# Patient Record
Sex: Male | Born: 1975 | Race: White | Hispanic: No | Marital: Married | Smoking: Current every day smoker
Health system: Southern US, Community
[De-identification: ages and names within clinical notes are randomized; demographics above are authoritative.]

## PROBLEM LIST (undated history)

## (undated) HISTORY — PX: APPENDECTOMY: SHX54

## (undated) HISTORY — PX: OTHER SURGICAL HISTORY: SHX169

---

## 2013-09-08 ENCOUNTER — Encounter (HOSPITAL_COMMUNITY)
Admission: EM | Disposition: A | Discharge status: Home / Self Care | Payer: Self-pay | Source: Home / Self Care | Attending: Emergency Medicine

## 2013-09-08 ENCOUNTER — Emergency Department (HOSPITAL_COMMUNITY): Payer: Worker's Compensation | Admitting: Certified Registered Nurse Anesthetist

## 2013-09-08 ENCOUNTER — Encounter (HOSPITAL_COMMUNITY): Payer: Worker's Compensation | Admitting: Certified Registered Nurse Anesthetist

## 2013-09-08 ENCOUNTER — Observation Stay (HOSPITAL_COMMUNITY)
Admission: EM | Admit: 2013-09-08 | Discharge: 2013-09-09 | Disposition: A | Discharge status: Home / Self Care | Payer: Worker's Compensation | Attending: Orthopedic Surgery | Admitting: Orthopedic Surgery

## 2013-09-08 ENCOUNTER — Emergency Department (HOSPITAL_COMMUNITY): Payer: Worker's Compensation

## 2013-09-08 ENCOUNTER — Encounter (HOSPITAL_COMMUNITY): Payer: Self-pay | Admitting: Emergency Medicine

## 2013-09-08 DIAGNOSIS — S51009A Unspecified open wound of unspecified elbow, initial encounter: Secondary | ICD-10-CM

## 2013-09-08 DIAGNOSIS — S51809A Unspecified open wound of unspecified forearm, initial encounter: Secondary | ICD-10-CM | POA: Insufficient documentation

## 2013-09-08 DIAGNOSIS — S66829A Laceration of other specified muscles, fascia and tendons at wrist and hand level, unspecified hand, initial encounter: Secondary | ICD-10-CM

## 2013-09-08 DIAGNOSIS — W298XXA Contact with other powered powered hand tools and household machinery, initial encounter: Secondary | ICD-10-CM | POA: Insufficient documentation

## 2013-09-08 DIAGNOSIS — S56529A Laceration of other extensor muscle, fascia and tendon at forearm level, unspecified arm, initial encounter: Secondary | ICD-10-CM

## 2013-09-08 DIAGNOSIS — S66909A Unspecified injury of unspecified muscle, fascia and tendon at wrist and hand level, unspecified hand, initial encounter: Principal | ICD-10-CM

## 2013-09-08 DIAGNOSIS — S61409A Unspecified open wound of unspecified hand, initial encounter: Principal | ICD-10-CM | POA: Diagnosis present

## 2013-09-08 DIAGNOSIS — F17200 Nicotine dependence, unspecified, uncomplicated: Secondary | ICD-10-CM | POA: Insufficient documentation

## 2013-09-08 DIAGNOSIS — S5420XA Injury of radial nerve at forearm level, unspecified arm, initial encounter: Secondary | ICD-10-CM | POA: Insufficient documentation

## 2013-09-08 DIAGNOSIS — S56909A Unspecified injury of unspecified muscles, fascia and tendons at forearm level, unspecified arm, initial encounter: Secondary | ICD-10-CM

## 2013-09-08 HISTORY — PX: NERVE AND ARTERY REPAIR: SHX5694

## 2013-09-08 HISTORY — PX: I & D EXTREMITY: SHX5045

## 2013-09-08 SURGERY — IRRIGATION AND DEBRIDEMENT EXTREMITY
Anesthesia: General | Laterality: Right

## 2013-09-08 MED ORDER — FENTANYL CITRATE 0.05 MG/ML IJ SOLN
INTRAMUSCULAR | Status: AC
  Filled 2013-09-08: qty 5

## 2013-09-08 MED ORDER — ONDANSETRON HCL 4 MG/2ML IJ SOLN
4.0000 mg | Freq: Once | INTRAMUSCULAR | Status: AC
  Administered 2013-09-08: 4 mg via INTRAVENOUS
  Filled 2013-09-08: qty 2

## 2013-09-08 MED ORDER — SUCCINYLCHOLINE CHLORIDE 20 MG/ML IJ SOLN
INTRAMUSCULAR | Status: AC
  Filled 2013-09-08: qty 1

## 2013-09-08 MED ORDER — ONDANSETRON HCL 4 MG/2ML IJ SOLN
4.0000 mg | Freq: Four times a day (QID) | INTRAMUSCULAR | Status: DC | PRN
  Administered 2013-09-09 (×2): 4 mg via INTRAVENOUS
  Filled 2013-09-08 (×2): qty 2

## 2013-09-08 MED ORDER — SENNA 8.6 MG PO TABS
1.0000 | ORAL_TABLET | Freq: Two times a day (BID) | ORAL | Status: DC
  Administered 2013-09-08 – 2013-09-09 (×2): 8.6 mg via ORAL
  Filled 2013-09-08 (×3): qty 1

## 2013-09-08 MED ORDER — FAMOTIDINE 20 MG PO TABS
20.0000 mg | ORAL_TABLET | Freq: Two times a day (BID) | ORAL | Status: DC | PRN
  Filled 2013-09-08: qty 1

## 2013-09-08 MED ORDER — CEFAZOLIN SODIUM 1-5 GM-% IV SOLN
1.0000 g | Freq: Three times a day (TID) | INTRAVENOUS | Status: DC
  Administered 2013-09-09 (×3): 1 g via INTRAVENOUS
  Filled 2013-09-08 (×5): qty 50

## 2013-09-08 MED ORDER — DEXAMETHASONE SODIUM PHOSPHATE 4 MG/ML IJ SOLN
INTRAMUSCULAR | Status: AC
  Filled 2013-09-08: qty 2

## 2013-09-08 MED ORDER — BUPIVACAINE HCL (PF) 0.25 % IJ SOLN
INTRAMUSCULAR | Status: DC | PRN
  Administered 2013-09-08: 30 mL

## 2013-09-08 MED ORDER — SODIUM CHLORIDE 0.9 % IR SOLN
Status: DC | PRN
  Administered 2013-09-08: 1000 mL

## 2013-09-08 MED ORDER — ALPRAZOLAM 0.5 MG PO TABS: 0.5000 mg | ORAL_TABLET | Freq: Four times a day (QID) | ORAL | Status: DC | PRN

## 2013-09-08 MED ORDER — MIDAZOLAM HCL 2 MG/2ML IJ SOLN
INTRAMUSCULAR | Status: AC
  Filled 2013-09-08: qty 2

## 2013-09-08 MED ORDER — GLYCOPYRROLATE 0.2 MG/ML IJ SOLN
INTRAMUSCULAR | Status: AC
Start: 2013-09-08 — End: 2013-09-08
  Filled 2013-09-08: qty 3

## 2013-09-08 MED ORDER — LIDOCAINE HCL (CARDIAC) 20 MG/ML IV SOLN
INTRAVENOUS | Status: DC | PRN
  Administered 2013-09-08: 40 mg via INTRAVENOUS

## 2013-09-08 MED ORDER — LACTATED RINGERS IV SOLN
INTRAVENOUS | Status: DC
  Administered 2013-09-08: 23:00:00 via INTRAVENOUS

## 2013-09-08 MED ORDER — SUCCINYLCHOLINE CHLORIDE 20 MG/ML IJ SOLN
INTRAMUSCULAR | Status: DC | PRN
  Administered 2013-09-08: 100 mg via INTRAVENOUS

## 2013-09-08 MED ORDER — ROCURONIUM BROMIDE 50 MG/5ML IV SOLN
INTRAVENOUS | Status: AC
  Filled 2013-09-08: qty 1

## 2013-09-08 MED ORDER — ROCURONIUM BROMIDE 100 MG/10ML IV SOLN
INTRAVENOUS | Status: DC | PRN
  Administered 2013-09-08: 30 mg via INTRAVENOUS

## 2013-09-08 MED ORDER — CEFAZOLIN SODIUM 1-5 GM-% IV SOLN
INTRAVENOUS | Status: AC
  Filled 2013-09-08: qty 50

## 2013-09-08 MED ORDER — GLYCOPYRROLATE 0.2 MG/ML IJ SOLN
INTRAMUSCULAR | Status: DC | PRN
  Administered 2013-09-08: .5 mg via INTRAVENOUS

## 2013-09-08 MED ORDER — DEXAMETHASONE SODIUM PHOSPHATE 4 MG/ML IJ SOLN
INTRAMUSCULAR | Status: DC | PRN
  Administered 2013-09-08: 8 mg via INTRAVENOUS

## 2013-09-08 MED ORDER — ONDANSETRON HCL 4 MG/2ML IJ SOLN
INTRAMUSCULAR | Status: AC
  Filled 2013-09-08: qty 2

## 2013-09-08 MED ORDER — METHOCARBAMOL 100 MG/ML IJ SOLN
500.0000 mg | Freq: Four times a day (QID) | INTRAVENOUS | Status: DC | PRN
  Filled 2013-09-08: qty 5

## 2013-09-08 MED ORDER — MORPHINE SULFATE 2 MG/ML IJ SOLN: 1.0000 mg | INTRAMUSCULAR | Status: DC | PRN

## 2013-09-08 MED ORDER — LIDOCAINE HCL (CARDIAC) 20 MG/ML IV SOLN
INTRAVENOUS | Status: AC
  Filled 2013-09-08: qty 5

## 2013-09-08 MED ORDER — CEFAZOLIN SODIUM-DEXTROSE 2-3 GM-% IV SOLR
1.0000 g | Freq: Once | INTRAVENOUS | Status: AC
  Administered 2013-09-08: 1 g via INTRAVENOUS

## 2013-09-08 MED ORDER — BUPIVACAINE HCL (PF) 0.25 % IJ SOLN
INTRAMUSCULAR | Status: AC
  Filled 2013-09-08: qty 30

## 2013-09-08 MED ORDER — OXYCODONE HCL 5 MG PO TABS
5.0000 mg | ORAL_TABLET | ORAL | Status: DC | PRN
  Administered 2013-09-09 (×4): 10 mg via ORAL
  Filled 2013-09-08 (×4): qty 2

## 2013-09-08 MED ORDER — METHOCARBAMOL 500 MG PO TABS
ORAL_TABLET | ORAL | Status: AC
  Filled 2013-09-08: qty 1

## 2013-09-08 MED ORDER — LACTATED RINGERS IV SOLN
INTRAVENOUS | Status: DC | PRN
  Administered 2013-09-08 (×2): via INTRAVENOUS

## 2013-09-08 MED ORDER — METHOCARBAMOL 500 MG PO TABS
500.0000 mg | ORAL_TABLET | Freq: Four times a day (QID) | ORAL | Status: DC | PRN
  Administered 2013-09-08 – 2013-09-09 (×2): 500 mg via ORAL
  Filled 2013-09-08 (×2): qty 1

## 2013-09-08 MED ORDER — CEFAZOLIN SODIUM 1-5 GM-% IV SOLN
1.0000 g | Freq: Once | INTRAVENOUS | Status: AC
  Administered 2013-09-08: 1 g via INTRAVENOUS
  Filled 2013-09-08: qty 50

## 2013-09-08 MED ORDER — GLYCOPYRROLATE 0.2 MG/ML IJ SOLN
INTRAMUSCULAR | Status: AC
  Filled 2013-09-08: qty 1

## 2013-09-08 MED ORDER — HYDROMORPHONE HCL PF 1 MG/ML IJ SOLN
0.2500 mg | INTRAMUSCULAR | Status: DC | PRN
  Administered 2013-09-08 (×2): 0.5 mg via INTRAVENOUS

## 2013-09-08 MED ORDER — OXYCODONE HCL 5 MG/5ML PO SOLN: 5.0000 mg | Freq: Once | ORAL | Status: AC | PRN

## 2013-09-08 MED ORDER — FENTANYL CITRATE 0.05 MG/ML IJ SOLN
INTRAMUSCULAR | Status: DC | PRN
  Administered 2013-09-08 (×2): 100 ug via INTRAVENOUS
  Administered 2013-09-08: 50 ug via INTRAVENOUS

## 2013-09-08 MED ORDER — OXYCODONE HCL 5 MG PO TABS
ORAL_TABLET | ORAL | Status: AC
  Filled 2013-09-08: qty 1

## 2013-09-08 MED ORDER — CEFAZOLIN SODIUM 1-5 GM-% IV SOLN: 1.0000 g | INTRAVENOUS | Status: DC

## 2013-09-08 MED ORDER — HYDROMORPHONE HCL PF 1 MG/ML IJ SOLN
INTRAMUSCULAR | Status: AC
  Filled 2013-09-08: qty 1

## 2013-09-08 MED ORDER — ARTIFICIAL TEARS OP OINT
TOPICAL_OINTMENT | OPHTHALMIC | Status: AC
  Filled 2013-09-08: qty 3.5

## 2013-09-08 MED ORDER — MORPHINE SULFATE 4 MG/ML IJ SOLN
4.0000 mg | INTRAMUSCULAR | Status: DC | PRN
  Administered 2013-09-08: 4 mg via INTRAVENOUS
  Filled 2013-09-08: qty 1

## 2013-09-08 MED ORDER — ONDANSETRON HCL 4 MG/2ML IJ SOLN
INTRAMUSCULAR | Status: DC | PRN
  Administered 2013-09-08: 4 mg via INTRAVENOUS

## 2013-09-08 MED ORDER — MORPHINE SULFATE 4 MG/ML IJ SOLN
4.0000 mg | INTRAMUSCULAR | Status: AC | PRN
  Administered 2013-09-08 (×3): 4 mg via INTRAVENOUS
  Filled 2013-09-08 (×2): qty 1

## 2013-09-08 MED ORDER — SODIUM CHLORIDE 0.9 % IR SOLN
Status: DC | PRN
  Administered 2013-09-08: 6000 mL

## 2013-09-08 MED ORDER — PROPOFOL 10 MG/ML IV BOLUS
INTRAVENOUS | Status: DC | PRN
  Administered 2013-09-08: 50 mg via INTRAVENOUS
  Administered 2013-09-08: 100 mg via INTRAVENOUS
  Administered 2013-09-08: 200 mg via INTRAVENOUS

## 2013-09-08 MED ORDER — VITAMIN C 500 MG PO TABS
1000.0000 mg | ORAL_TABLET | Freq: Every day | ORAL | Status: DC
  Administered 2013-09-08 – 2013-09-09 (×2): 1000 mg via ORAL
  Filled 2013-09-08 (×2): qty 2

## 2013-09-08 MED ORDER — ONDANSETRON HCL 4 MG PO TABS: 4.0000 mg | ORAL_TABLET | Freq: Four times a day (QID) | ORAL | Status: DC | PRN

## 2013-09-08 MED ORDER — DIPHENHYDRAMINE HCL 25 MG PO CAPS: 25.0000 mg | ORAL_CAPSULE | Freq: Four times a day (QID) | ORAL | Status: DC | PRN

## 2013-09-08 MED ORDER — OXYCODONE HCL 5 MG PO TABS
5.0000 mg | ORAL_TABLET | Freq: Once | ORAL | Status: AC | PRN
  Administered 2013-09-08: 5 mg via ORAL

## 2013-09-08 MED ORDER — ARTIFICIAL TEARS OP OINT
TOPICAL_OINTMENT | OPHTHALMIC | Status: DC | PRN
  Administered 2013-09-08: 1 via OPHTHALMIC

## 2013-09-08 MED ORDER — NEOSTIGMINE METHYLSULFATE 1 MG/ML IJ SOLN
INTRAMUSCULAR | Status: DC | PRN
  Administered 2013-09-08: 3 mg via INTRAVENOUS

## 2013-09-08 SURGICAL SUPPLY — 78 items
BANDAGE CONFORM 2  STR LF (GAUZE/BANDAGES/DRESSINGS) IMPLANT
BANDAGE ELASTIC 3 VELCRO ST LF (GAUZE/BANDAGES/DRESSINGS) ×3 IMPLANT
BANDAGE ELASTIC 4 VELCRO ST LF (GAUZE/BANDAGES/DRESSINGS) IMPLANT
BANDAGE GAUZE ELAST BULKY 4 IN (GAUZE/BANDAGES/DRESSINGS) ×3 IMPLANT
BNDG COHESIVE 1X5 TAN STRL LF (GAUZE/BANDAGES/DRESSINGS) IMPLANT
CANISTER SUCTION 1500CC (MISCELLANEOUS) ×3 IMPLANT
CORDS BIPOLAR (ELECTRODE) ×3 IMPLANT
COVER SURGICAL LIGHT HANDLE (MISCELLANEOUS) ×3 IMPLANT
CUFF TOURNIQUET SINGLE 18IN (TOURNIQUET CUFF) ×3 IMPLANT
CUFF TOURNIQUET SINGLE 24IN (TOURNIQUET CUFF) IMPLANT
DECANTER SPIKE VIAL GLASS SM (MISCELLANEOUS) ×3 IMPLANT
DRAPE SURG 17X23 STRL (DRAPES) ×3 IMPLANT
DRSG ADAPTIC 3X8 NADH LF (GAUZE/BANDAGES/DRESSINGS) ×3 IMPLANT
ELECT REM PT RETURN 9FT ADLT (ELECTROSURGICAL) ×3
ELECTRODE REM PT RTRN 9FT ADLT (ELECTROSURGICAL) ×1 IMPLANT
FLUID NSS /IRRIG 3000 ML XXX (IV SOLUTION) ×6 IMPLANT
GAUZE SPONGE 2X2 8PLY STRL LF (GAUZE/BANDAGES/DRESSINGS) IMPLANT
GAUZE XEROFORM 1X8 LF (GAUZE/BANDAGES/DRESSINGS) IMPLANT
GAUZE XEROFORM 5X9 LF (GAUZE/BANDAGES/DRESSINGS) ×3 IMPLANT
GLOVE BIO SURGEON STRL SZ7 (GLOVE) ×3 IMPLANT
GLOVE BIOGEL M STRL SZ7.5 (GLOVE) ×3 IMPLANT
GLOVE BIOGEL PI IND STRL 7.0 (GLOVE) ×1 IMPLANT
GLOVE BIOGEL PI IND STRL 7.5 (GLOVE) ×1 IMPLANT
GLOVE BIOGEL PI INDICATOR 7.0 (GLOVE) ×2
GLOVE BIOGEL PI INDICATOR 7.5 (GLOVE) ×2
GLOVE ECLIPSE 7.0 STRL STRAW (GLOVE) ×3 IMPLANT
GLOVE SS BIOGEL STRL SZ 8 (GLOVE) ×1 IMPLANT
GLOVE SUPERSENSE BIOGEL SZ 8 (GLOVE) ×2
GOWN STRL REUS W/ TWL LRG LVL3 (GOWN DISPOSABLE) ×1 IMPLANT
GOWN STRL REUS W/ TWL XL LVL3 (GOWN DISPOSABLE) ×2 IMPLANT
GOWN STRL REUS W/TWL LRG LVL3 (GOWN DISPOSABLE) ×2
GOWN STRL REUS W/TWL XL LVL3 (GOWN DISPOSABLE) ×4
HANDPIECE INTERPULSE COAX TIP (DISPOSABLE)
KIT BASIN OR (CUSTOM PROCEDURE TRAY) ×3 IMPLANT
KIT ROOM TURNOVER OR (KITS) ×3 IMPLANT
MANIFOLD NEPTUNE II (INSTRUMENTS) IMPLANT
NEEDLE HYPO 25GX1X1/2 BEV (NEEDLE) ×3 IMPLANT
NERVE GUIDE NEURAGIEN 3MM (Tissue) ×3 IMPLANT
NS IRRIG 1000ML POUR BTL (IV SOLUTION) ×3 IMPLANT
PACK ORTHO EXTREMITY (CUSTOM PROCEDURE TRAY) ×3 IMPLANT
PAD ARMBOARD 7.5X6 YLW CONV (MISCELLANEOUS) ×6 IMPLANT
PAD CAST 3X4 CTTN HI CHSV (CAST SUPPLIES) ×1 IMPLANT
PAD CAST 4YDX4 CTTN HI CHSV (CAST SUPPLIES) ×1 IMPLANT
PADDING CAST COTTON 3X4 STRL (CAST SUPPLIES) ×2
PADDING CAST COTTON 4X4 STRL (CAST SUPPLIES) ×2
SET CYSTO W/LG BORE CLAMP LF (SET/KITS/TRAYS/PACK) ×3 IMPLANT
SET HNDPC FAN SPRY TIP SCT (DISPOSABLE) IMPLANT
SOLUTION BETADINE 4OZ (MISCELLANEOUS) ×3 IMPLANT
SPECIMEN JAR SMALL (MISCELLANEOUS) ×3 IMPLANT
SPLINT FIBERGLASS 4X30 (CAST SUPPLIES) ×3 IMPLANT
SPONGE GAUZE 2X2 STER 10/PKG (GAUZE/BANDAGES/DRESSINGS)
SPONGE GAUZE 4X4 12PLY (GAUZE/BANDAGES/DRESSINGS) IMPLANT
SPONGE GAUZE 4X4 12PLY STER LF (GAUZE/BANDAGES/DRESSINGS) ×6 IMPLANT
SPONGE LAP 18X18 X RAY DECT (DISPOSABLE) ×3 IMPLANT
SPONGE LAP 4X18 X RAY DECT (DISPOSABLE) ×3 IMPLANT
SPONGE SCRUB IODOPHOR (GAUZE/BANDAGES/DRESSINGS) ×3 IMPLANT
STOCKINETTE TUBULAR SYNTH 4IN (CAST SUPPLIES) ×3 IMPLANT
SUCTION FRAZIER TIP 10 FR DISP (SUCTIONS) ×3 IMPLANT
SUT CHROMIC 4 0 PS 2 18 (SUTURE) ×3 IMPLANT
SUT ETHILON 8 0 TG100 8 (SUTURE) ×3 IMPLANT
SUT FIBERWIRE 3-0 18 TAPR NDL (SUTURE) ×18
SUT MERSILENE 4 0 P 3 (SUTURE) IMPLANT
SUT PROLENE 3 0 PS 2 (SUTURE) ×15 IMPLANT
SUT PROLENE 4 0 PS 2 18 (SUTURE) IMPLANT
SUT VIC AB 2-0 CT1 27 (SUTURE)
SUT VIC AB 2-0 CT1 TAPERPNT 27 (SUTURE) IMPLANT
SUT VIC AB 3-0 FS2 27 (SUTURE) ×3 IMPLANT
SUT VIC AB 4-0 PS2 27 (SUTURE) ×3 IMPLANT
SUTURE FIBERWR 3-0 18 TAPR NDL (SUTURE) ×6 IMPLANT
SYR CONTROL 10ML LL (SYRINGE) ×3 IMPLANT
TOWEL OR 17X24 6PK STRL BLUE (TOWEL DISPOSABLE) ×6 IMPLANT
TOWEL OR 17X26 10 PK STRL BLUE (TOWEL DISPOSABLE) ×3 IMPLANT
TUBE ANAEROBIC SPECIMEN COL (MISCELLANEOUS) IMPLANT
TUBE CONNECTING 12'X1/4 (SUCTIONS) ×1
TUBE CONNECTING 12X1/4 (SUCTIONS) ×2 IMPLANT
UNDERPAD 30X30 INCONTINENT (UNDERPADS AND DIAPERS) ×3 IMPLANT
WATER STERILE IRR 1000ML POUR (IV SOLUTION) ×3 IMPLANT
YANKAUER SUCT BULB TIP NO VENT (SUCTIONS) ×3 IMPLANT

## 2013-09-08 NOTE — Op Note (Signed)
See dict #[id number] Amanda PeaGramig MD

## 2013-09-08 NOTE — Transfer of Care (Signed)
Immediate Anesthesia Transfer of Care Note  Patient: Arthur Perry  Procedure(s) Performed: Procedure(s): IRRIGATION AND DEBRIDEMENT EXTREMITY (Right) NERVE AND ARTERY REPAIR (Right)  Patient Location: PACU  Anesthesia Type:General  Level of Consciousness: awake, alert  and oriented  Airway & Oxygen Therapy: Patient Spontanous Breathing and Patient connected to face mask oxygen  Post-op Assessment: Report given to PACU RN and Post -op Vital signs reviewed and stable  Post vital signs: Reviewed and stable  Complications: No apparent anesthesia complications

## 2013-09-08 NOTE — ED Notes (Addendum)
Pt presents to department for evaluation of R hand and forearm injury. States chainsaw was accidentally dropped on hand and arm. (2) avulsions noted, exposed bone and muscle noted. Unable to move R middle finger. Sensation intact to all digits. Distal pulses intact. 9/10 pain upon arrival. No other injuries noted. Pt is alert and oriented x4. Received Fentanyl per EMS. Tetanus unknown.

## 2013-09-08 NOTE — ED Notes (Signed)
Pt waiting to go to OR, pt to be transferred to holding went ready.

## 2013-09-08 NOTE — ED Notes (Signed)
Dr. Amanda PeaGramig at the bedside. MD talked with pt and now transporting upstairs. Family given pt's belongings.

## 2013-09-08 NOTE — Preoperative (Signed)
Beta Blockers   Reason not to administer Beta Blockers:Not Applicable 

## 2013-09-08 NOTE — Consult Note (Signed)
Reason for Consult: Chainsaw injury to the right hand Referring physician: [facility]  Jonthan Leite is an 38 y.o. male.  HPI: The patient is a very pleasant 38 year old male who presents to the emergency room setting for evaluation of his right hand. He was holding a branch earlier today while an assistant was using a chain saw to cut the branch, apparently the chain saw slipped and subsequently lacerated the dorsal aspect of the patient's right hand and forearm. He notes inability to extend the middle finger, and states he was able to see the bone. The patient wrapped the hand and called 911. He denies injury to the elbow the shoulder or the opposite extremity. He denies numbness or tingling to the upper extremity. The patient has been seen and evaluated by the emergency room staff, radiograph showed no acute bony abnormalities.   History reviewed. No pertinent past medical history.  History reviewed. No pertinent past surgical history.  No family history on file.  Social History:  reports that he has been smoking Cigarettes.  He has been smoking about 0.00 packs per day. He does not have any smokeless tobacco history on file. He reports that he does not drink alcohol or use illicit drugs.  Allergies:  Allergies  Allergen Reactions  . Penicillins Other (See Comments)    Childhood reaction    Medications: None   No results found for this or any previous visit (from the past 48 hour(s)).  Dg Forearm Right  09/08/2013   CLINICAL DATA Posterior laceration over the right hand and distal forearm  EXAM RIGHT FOREARM - 2 VIEW  COMPARISON None.  FINDINGS AP and lateral views of the 4 head forearm reveal the radius and ulna to be intact. There are no soft tissue foreign bodies. There is disruption of the soft tissues over the distal third of the forearm dorsolaterally.  IMPRESSION There is no acute bony abnormality of the right radius or ulna.  SIGNATURE  Electronically Signed    By: David  [place]   On: 09/08/2013 13:50   Dg Hand Complete Right  09/08/2013   CLINICAL DATA Unable to move middle finger. Large laceration over the posterior aspect of the right hand  EXAM RIGHT HAND - COMPLETE 3+ VIEW  COMPARISON None  FINDINGS The phalanges and metacarpals appear intact. The interphalangeal joints and metacarpophalangeal joints also appear normal. The carpal bones and distal radius and ulna appear intact. There is soft tissue disruption over the metacarpal region. Bandage material is present. No foreign bodies are evident.  IMPRESSION There is no acute bony abnormality of the right hand. There is soft tissue injury over the dorsum of the metacarpal region.  SIGNATURE  Electronically Signed   By: David  [place]   On: 09/08/2013 13:49    Review of Systems  Constitutional: Negative.   HENT: Negative.   Eyes: Negative.   Respiratory: Negative.   Cardiovascular: Negative.   Gastrointestinal: Negative.   Genitourinary: Negative.   Musculoskeletal:       See HPI  Skin: Negative.   Neurological: Negative.   Endo/Heme/Allergies: Negative.   Psychiatric/Behavioral: Negative.    Blood pressure 118/76, pulse 64, temperature 98.3 F (36.8 C), temperature source Oral, resp. rate 18, SpO2 97.00%. Physical Exam ..Patient presents for evaluation and treatment of the of their upper extremity predicament. The patient denies neck back chest or of abdominal pain. The patient notes that they have no lower extremity problems. The patient from primarily complains of the upper extremity  pain noted. Evaluation of the right hand shows that he has obvious loss of EDC function to the middle finger, he has a 4 cm laceration about the dorsal aspect of the hand and in addition he has a 3 cm laceration to the dorsal forearm. Sensation refill are intact to the digit, flexor tendons are intact  Assessment/Plan: Chainsaw injury to the dorsal aspect of the right hand and forearm with notable loss of EDC  function and soft tissue disarray. Marland Kitchen..We are planning surgery for your upper extremity. The risk and benefits of surgery include risk of bleeding infection anesthesia damage to normal structures and failure of the surgery to accomplish its intended goals of relieving symptoms and restoring function with this in mind we'll going to proceed. I have specifically discussed with the patient the pre-and postoperative regime and the does and don'ts and risk and benefits in great detail. Risk and benefits of surgery also include risk of dystrophy chronic nerve pain failure of the healing process to go onto completion and other inherent risks of surgery The relavent the pathophysiology of the disease/injury process, as well as the alternatives for treatment and postoperative course of action has been discussed in great detail with the patient who desires to proceed.  We will do everything in our power to help you (the patient) restore function to the upper extremity. Is a pleasure to see this patient today.     Ekam Bonebrake L 09/08/2013, 5:08 PM

## 2013-09-08 NOTE — ED Notes (Signed)
Pt transported to xray 

## 2013-09-08 NOTE — ED Notes (Signed)
SPOKE WITH Arthur Perry IN OR. THEY ADVISE THAT DR Amanda PeaGRAMIG HAS ONE CASE AHEAD OF THIS PATIENT. STATES DR Amanda PeaGRAMIG HAS NOT ARRIVED TO START HIS FIRST CASE. SHE ADVISES MAY BE A COUPLE HOURS

## 2013-09-08 NOTE — ED Notes (Signed)
Report called to OR. Pt to be taken to [facility].

## 2013-09-08 NOTE — ED Notes (Addendum)
EDP, family at the bedside

## 2013-09-08 NOTE — Anesthesia Postprocedure Evaluation (Signed)
  Anesthesia Post-op Note  Patient: Arthur Perry  Procedure(s) Performed: Procedure(s): IRRIGATION AND DEBRIDEMENT EXTREMITY (Right) NERVE AND ARTERY REPAIR (Right)  Patient Location: PACU  Anesthesia Type:General  Level of Consciousness: awake, alert , oriented and patient cooperative  Airway and Oxygen Therapy: Patient Spontanous Breathing  Post-op Pain: mild  Post-op Assessment: Post-op Vital signs reviewed, Patient's Cardiovascular Status Stable, Respiratory Function Stable, Patent Airway, No signs of Nausea or vomiting and Pain level controlled  Post-op Vital Signs: stable  Complications: No apparent anesthesia complications

## 2013-09-08 NOTE — Anesthesia Preprocedure Evaluation (Signed)
Anesthesia Evaluation  Patient identified by MRN, date of birth, ID band Patient awake    Reviewed: Allergy & Precautions, H&P , NPO status , Patient's Chart, lab work & pertinent test results  Airway Mallampati: II TM Distance: >3 FB Neck ROM: Full    Dental no notable dental hx. (+) Teeth Intact, Dental Advisory Given   Pulmonary Current Smoker,  breath sounds clear to auscultation  Pulmonary exam normal       Cardiovascular negative cardio ROS  Rhythm:Regular Rate:Normal     Neuro/Psych negative neurological ROS  negative psych ROS   GI/Hepatic negative GI ROS, Neg liver ROS,   Endo/Other  negative endocrine ROS  Renal/GU negative Renal ROS  negative genitourinary   Musculoskeletal   Abdominal   Peds  Hematology negative hematology ROS (+)   Anesthesia Other Findings   Reproductive/Obstetrics negative OB ROS                           Anesthesia Physical Anesthesia Plan  ASA: I and emergent  Anesthesia Plan: General   Post-op Pain Management:    Induction: Intravenous, Rapid sequence and Cricoid pressure planned  Airway Management Planned: Oral ETT  Additional Equipment:   Intra-op Plan:   Post-operative Plan: Extubation in OR  Informed Consent: I have reviewed the patients History and Physical, chart, labs and discussed the procedure including the risks, benefits and alternatives for the proposed anesthesia with the patient or authorized representative who has indicated his/her understanding and acceptance.   Dental advisory given  Plan Discussed with: CRNA  Anesthesia Plan Comments:         Anesthesia Quick Evaluation

## 2013-09-08 NOTE — ED Provider Notes (Signed)
CSN: [id number]     Arrival date & time 09/08/13  1309 History   First MD Initiated Contact with Patient 09/08/13 1310     Chief Complaint  Patient presents with  . Hand Injury  . Arm Injury      HPI  Patient was using a chain saw when it caught and jumped. He has lacerations to the dorsum of his hand and dorsum of his right distal forearm. He cannot lift his right middle finger. Minimal bleeding. No pulsatile bleeding.  History reviewed. No pertinent past medical history. History reviewed. No pertinent past surgical history. No family history on file. History  Substance Use Topics  . Smoking status: Current Every Day Smoker    Types: Cigarettes  . Smokeless tobacco: Not on file  . Alcohol Use: No    Review of Systems  Constitutional: Negative for fever, chills, diaphoresis, appetite change and fatigue.  HENT: Negative for mouth sores, sore throat and trouble swallowing.   Eyes: Negative for visual disturbance.  Respiratory: Negative for cough, chest tightness, shortness of breath and wheezing.   Cardiovascular: Negative for chest pain.  Gastrointestinal: Negative for nausea, vomiting, abdominal pain, diarrhea and abdominal distention.  Endocrine: Negative for polydipsia, polyphagia and polyuria.  Genitourinary: Negative for dysuria, frequency and hematuria.  Musculoskeletal: Negative for gait problem.  Skin: Positive for wound. Negative for color change, pallor and rash.  Neurological: Negative for dizziness, syncope, light-headedness and headaches.  Hematological: Does not bruise/bleed easily.  Psychiatric/Behavioral: Negative for behavioral problems and confusion.      Allergies  Penicillins  Home Medications  No current outpatient prescriptions on file. BP 135/68  Pulse 75  Temp(Src) 99.2 F (37.3 C) (Oral)  Resp 20  SpO2 100% Physical Exam  Musculoskeletal:       Arms:      Hands:   ED Course  Procedures (including critical care time) Labs  Review Labs Reviewed - No data to display Imaging Review Dg Forearm Right  09/08/2013   CLINICAL DATA Posterior laceration over the right hand and distal forearm  EXAM RIGHT FOREARM - 2 VIEW  COMPARISON None.  FINDINGS AP and lateral views of the 4 head forearm reveal the radius and ulna to be intact. There are no soft tissue foreign bodies. There is disruption of the soft tissues over the distal third of the forearm dorsolaterally.  IMPRESSION There is no acute bony abnormality of the right radius or ulna.  SIGNATURE  Electronically Signed   By: David  [place]   On: 09/08/2013 13:50   Dg Hand Complete Right  09/08/2013   CLINICAL DATA Unable to move middle finger. Large laceration over the posterior aspect of the right hand  EXAM RIGHT HAND - COMPLETE 3+ VIEW  COMPARISON None  FINDINGS The phalanges and metacarpals appear intact. The interphalangeal joints and metacarpophalangeal joints also appear normal. The carpal bones and distal radius and ulna appear intact. There is soft tissue disruption over the metacarpal region. Bandage material is present. No foreign bodies are evident.  IMPRESSION There is no acute bony abnormality of the right hand. There is soft tissue injury over the dorsum of the metacarpal region.  SIGNATURE  Electronically Signed   By: David  [place]   On: 09/08/2013 13:49     EKG Interpretation None      MDM   Final diagnoses:  None    Soft tissue injuries to the dorsum hand in zone 4/5. Also soft tissue injury to the dorsum of the forearm  zone 7 or 8. He has complete third digit extensor tendon injury. He is neurologically intact. He has good capillary refill is in the pulses to the hand. X-ray show no bony abnormality. His tetanus is up-to-date. His given IV Ancef. Place a wet gauze dressing. Discussed case with hand surgery on call Dr. Amanda Pea.  Dr. Amanda Pea and return my call immediately as he was available after finishing case the operating room. He'll see the patient in the  emergency room and plan is operative intervention.    Rolland Porter, MD 09/09/13 [phone]

## 2013-09-09 MED ORDER — METHOCARBAMOL 500 MG PO TABS: 500.0000 mg | ORAL_TABLET | Freq: Four times a day (QID) | ORAL | Status: AC | PRN

## 2013-09-09 MED ORDER — SENNA 8.6 MG PO TABS: 1.0000 | ORAL_TABLET | Freq: Two times a day (BID) | ORAL | Status: AC

## 2013-09-09 MED ORDER — SULFAMETHOXAZOLE-TMP DS 800-160 MG PO TABS: 1.0000 | ORAL_TABLET | Freq: Two times a day (BID) | ORAL | Status: AC

## 2013-09-09 MED ORDER — ASCORBIC ACID 1000 MG PO TABS: 1000.0000 mg | ORAL_TABLET | Freq: Every day | ORAL | Status: AC

## 2013-09-09 MED ORDER — OXYCODONE HCL 5 MG PO TABS: 5.0000 mg | ORAL_TABLET | ORAL | Status: AC | PRN

## 2013-09-09 NOTE — Op Note (Signed)
[name], Arthur Perry              ACCOUNT NO.:  [id number]  MEDICAL RECORD NO.:  [id number]  LOCATION:  [id number]                        FACILITY:  [facility]  PHYSICIAN:  Arthur Perry, Arthur PerryDATE OF BIRTH:  March 30, 1976  DATE OF PROCEDURE: DATE OF DISCHARGE:                              OPERATIVE REPORT   PREOPERATIVE DIAGNOSIS:  Chainsaw injury to the right hand and forearm. This was a segmental injury with tendon involvement.  POSTOPERATIVE DIAGNOSIS:  Chainsaw injury to the right hand and forearm. This was a segmental injury with tendon involvement.  PROCEDURE: 1. I and D, right dorsal hand skin, subcutaneous tissue, muscle,     tendon, and periosteal tissue.  This was an excisional debridement     in nature with curette, knife blade and scissor tip/scissors. 2. I and D, right forearm, mid aspect skin, subcutaneous tissue,     muscle, tendon.  This was an excisional debridement with curette,     forceps, knife blade, and scissors. 3. Repair, abductor pollicis longus tendon, right forearm. 4. Superficial radial nerve neurolysis, right forearm. 5. Extensor digitorum communis repair, right dorsal hand. 6. Extensor digitorum communis repair, right middle finger dorsal     hand. 7. Repair, extensor indicis proprius, right index finger. 8. Repair, superficial radial nerve with direct repair and nerve wrap,     right hand. 9. Rotation flap coverage, right forearm. 10.Rotation flap coverage, right dorsal hand.  SURGEON:  Arthur Perry, M.D.  ASSISTANT:  Arthur Perry, P.A.C.  COMPLICATION:  None.  ANESTHESIA:  General.  TOURNIQUET TIME:  Less than an hour.  INDICATIONS:  This patient is a pleasant male, 38 years of age.  He presents with the above-mentioned diagnosis.  I have counseled him in regard to risks and benefits of surgery, including risk of infection, bleeding, anesthesia, damage to normal structures, and failure of surgery to accomplish its intended goals of  relieving symptoms and restoring function.  The patient had a chainsaw injury today, segmental in nature with tendon involvement.  He understands the urgent need for I and D, repairs necessary of the affected structures.  OPERATIVE PROCEDURE:  The patient was seen by myself, underwent anesthetic in form of general anesthesia, laid supine and fully padded, prepped and draped in usual sterile fashion with Betadine scrub and paint.  Time-out was called.  Preop Ancef was given.  Following this, he underwent a curvilinear incision.  The patient had 2 segmental defects about the right upper extremity, 1 about the dorsal hand, 1 about the dorsal right forearm.  I have performed excision with 1 mm rim of skin tissue about the forearm and about the dorsal hand.  These were 8 cm laceration, jagged in nature with exposed tendon muscle and dirty debride.  We incised the 1 mm rim of skin tissue about the dorsal hand, 8 cm in length, and right forearm, 8 cm in length.  These were 2 separate areas.  Following this, I performed I and D of skin, subcutaneous tissue, muscle, tendon, and paratenon as well as periosteal tissue about the right hand.  This was an excisional debridement with scissor tip, knife blade, and curette.  Following this, we performed excisional debridement of  the skin, subcutaneous tissue, muscle, tendon, and paratenon tissue about the right dorsal forearm.  Following this, we then performed irrigation of 6 L of fluid through and through both from wounds.  The patient tolerated this well.  Following this, I performed a dorsal fasciotomy about the forearm to allow for muscles debride.  Following this, I then performed very careful and cautious look to the dorsal forearm.  I performed a superficial radial nerve neurolysis. Fortunately, the nerve was intact.  I removed debris around it of course during the dissection.  Following this, we performed an APL repair.  The APL was  repaired without difficulty utilizing a modified Kessler-Tajima 3-0 FiberWire stitch.  This was done in the dorsal forearm.  Following this, we then turned attention towards the dorsal hand.  At this time[facility] finger was repaired with a 4 strand 3-0 FiberWire repair with modified Kessler-Tajima suture.  Following this, I performed a EIP tendon repair about the index finger with modified Kessler-Tajima stitching.  Following this, we then performed a EDC repair to the middle finger with modified Kessler-Tajima stitch.  I should note the EIP did not have a full laceration, but had enough to require repair.  There were some portions of it which still had some continuity.  Following this, we then soaked a NeuraGen nerve tube and placed it over the superficial radial nerve about the hand where it was lacerated.  The nerve ends were then freshened, coapted with 8-0 nylon and nerve tube was then placed over the top of this to hopefully prevent neuroma formation.  The patient tolerated this reasonably well.  The tourniquet was up for brief periods of time.  Hemostasis was secured.  I did have the top 2 large veins in the dorsal forearm.  Once this done, we performed a rotation flap closure of the dorsal hand utilizing 3-0 Prolene.  Following this, we performed a rotation flap coverage procedure about the dorsal right forearm with 3-0 Prolene.  The flaps actually served very well to close the defect.  Certainly, a chainsaw injury is a mangling injury but we are nevertheless able to get a good tendon apposition and good closure with rotation flap coverage.  He had excellent refill soft compartments.  There were no complicating features.  We will admit him overnight for IV antibiotics, general postop observation, and other measures.  I would like to get 24 hours of antibiotics in this gentleman given the severity of his injury.  These notes have been discussed.  All  questions have been encouraged and answered.  We will monitor his condition very closely in the postoperative period.     Arthur Perry, M.D.     [facility]moreWMG/MEDQ  D:  09/08/2013  T:  09/09/2013  Job:  [id number]

## 2013-09-09 NOTE — Discharge Instructions (Signed)
Please keep your bandage clean and dry  Please call to see Dr. Amanda PeaGramig in 12 days  Notify should any problems arise  Take the antibiotics until they are complete  We recommend that you to take vitamin C 1000 mg a day to promote healing we also recommend that if you require her pain medicine that he take a stool softener to prevent constipation as most pain medicines will have constipation side effects. We recommend either Peri-Colace or Senokot and recommend that you also consider adding MiraLAX to prevent the constipation affects from pain medicine if you are required to use them. These medicines are over the counter and maybe purchased at a local pharmacy.  Keep bandage clean and dry.  Call for any problems.  No smoking.  Criteria for driving a car: you should be off your pain medicine for 7-8 hours, able to drive one handed(confident), thinking clearly and feeling able in your judgement to drive. Continue elevation as it will decrease swelling.  If instructed by MD move your fingers within the confines of the bandage/splint.  Use ice if instructed by your MD. Call immediately for any sudden loss of feeling in your hand/arm or change in functional abilities of the extremity.

## 2013-09-09 NOTE — Progress Notes (Signed)
Patient ID: Arthur DunnStephen Bayliss, male   DOB: 04/22/1976, 38 y.o.   MRN: [id number] Please see discharge summary.  Final diagnosis: status post reconstruction right forearm and hand status post chain saw injury with multiple nerve tendon and soft tissue reconstructive measures performed.  Dominica SeverinWilliam Tannie Koskela

## 2013-09-09 NOTE — Discharge Summary (Signed)
Physician Discharge Summary  Patient ID: Arthur DunnStephen Demetrius MRN: [id number] DOB/AGE: 37/07/1975 37 y.o.  Admit date: 09/08/2013 Discharge date: 09/09/2013  Admission Diagnoses: Chainsaw injury with multiple tendon lacerations and wound disarray status post surgical reconstruction 09/08/2013 Discharge Diagnoses: Same Active Problems:   Extensor tendon laceration, hand, open wound   Discharged Condition: fair  Hospital Course: Patient was admitted postop for IV antibiotics and observation status post surgical intervention. He did well had no problems and is ready for discharge. He is awake alert and oriented and has no complications at this juncture.  Consults: None  Significant Diagnostic Studies: labs: See chart  Treatments: surgery: See chart  Discharge Exam: Blood pressure 120/71, pulse 64, temperature 98.1 F (36.7 C), temperature source Oral, resp. rate 18, SpO2 96.00%. General appearance: alert and cooperative patient looks very well has intact refill no complications. No signs of compartment syndrome. No signs of abnormality about the bandage. He understands to keep this clean and dry.  The patient is alert and oriented in no acute distress the patient complains of pain in the affected upper extremity.  The patient is noted to have a normal HEENT exam.  Lung fields show equal chest expansion and no shortness of breath  abdomen exam is nontender without distention.  Lower extremity examination does not show any fracture dislocation or blood clot symptoms.  Pelvis is stable neck and back are stable and nontender  Disposition: Final discharge disposition not confirmed     Medication List    Notice   You have not been prescribed any medications.         Follow-up Information   Follow up with Karen ChafeGRAMIG III,Ibraham Levi M, MD In 12 days.   Specialty:  Orthopedic Surgery   Contact information:   [address] [place] [address] [phone]        Signed: Karen ChafeGRAMIG III,Jazma Pickel M 09/09/2013, 7:04 AM

## 2013-09-09 NOTE — Evaluation (Signed)
Occupational Therapy Evaluation Patient Details Name: Arthur Perry MRN: [id number] DOB: 1976/01/07 Today's Date: 09/09/2013 Time: 4098-1191 OT Time Calculation (min): 28 min  OT Assessment / Plan / Recommendation History of present illness s/p reconstruction of multiple tendon lacerations and wound disarray following an injury with a chain saw to R UE.   Clinical Impression   Pt educated in edema management and 3 pillows provided for elevation, icing, AROM of R shoulder and elbow.  Instructed in hemi techniques for bathing and dressing. Pt verbalized and/or demonstrated understanding of all.  No further OT needs. Pt will have assist of his family upon discharge as needed.    OT Assessment  Patient does not need any further OT services    Follow Up Recommendations  No OT follow up (at MDs discretion after follow up visit)    Barriers to Discharge      Equipment Recommendations  None recommended by OT    Recommendations for Other Services    Frequency       Precautions / Restrictions Precautions Precautions: None   Pertinent Vitals/Pain 5/10 R UE, elevated and requested pt meds, VSS    ADL  Eating/Feeding: Modified independent Where Assessed - Eating/Feeding: Edge of bed Grooming: Wash/dry face;Modified independent Where Assessed - Grooming: Unsupported standing Upper Body Bathing: Modified independent Where Assessed - Upper Body Bathing: Unsupported standing Lower Body Bathing: Modified independent Where Assessed - Lower Body Bathing: Unsupported standing Upper Body Dressing: Modified independent Where Assessed - Upper Body Dressing: Unsupported sitting Lower Body Dressing: Modified independent Where Assessed - Lower Body Dressing: Unsupported sitting;Supported sit to stand Toilet Transfer: Modified independent Toilet Transfer Method: Sit to stand Transfers/Ambulation Related to ADLs: independent ADL Comments: Instructed pt in hemi techniques for bathing and dressing.     OT Diagnosis:    OT Problem List:   OT Treatment Interventions:     OT Goals(Current goals can be found in the care plan section) Acute Rehab OT Goals Patient Stated Goal: regain use of R UE  Visit Information  Last OT Received On: 09/09/13 History of Present Illness: s/p reconstruction of multiple tendon lacerations and wound disarray following an injury with a chain saw to R UE.       Prior Functioning     Home Living Family/patient expects to be discharged to:: Private residence Living Arrangements: Spouse/significant other;Children Available Help at Discharge: Family;Available PRN/intermittently Prior Function Level of Independence: Independent Comments: works in Production assistant, radio: No difficulties Dominant Hand: Right         Vision/Perception Vision - History Patient Visual Report: No change from baseline   Cognition  Cognition Arousal/Alertness: Awake/alert Behavior During Therapy: WFL for tasks assessed/performed Overall Cognitive Status: Within Functional Limits for tasks assessed    Extremity/Trunk Assessment Upper Extremity Assessment Upper Extremity Assessment: RUE deficits/detail RUE Deficits / Details: R UE with splint and dressing from fingers to forearm.  Full AROM elbow and shoulder.  Intact sensation in finger tips. RUE Coordination: decreased fine motor Lower Extremity Assessment Lower Extremity Assessment: Overall WFL for tasks assessed Cervical / Trunk Assessment Cervical / Trunk Assessment: Normal     Mobility Bed Mobility Overal bed mobility: Independent Transfers Overall transfer level: Independent     Exercise     Balance     End of Session OT - End of Session Activity Tolerance: Patient tolerated treatment well Patient left: in bed;with call bell/phone within reach;with nursing/sitter in room Nurse Communication: Patient requests pain meds  GO Functional Assessment Tool Used: clinical  judgement Functional Limitation: Self care Self Care Current Status [phone](G8987): At least 1 percent but less than 20 percent impaired, limited or restricted Self Care Goal Status ([id number]): At least 1 percent but less than 20 percent impaired, limited or restricted Self Care Discharge Status [phone](G8989): At least 1 percent but less than 20 percent impaired, limited or restricted   Evern [employer], Arthur Perry 09/09/2013, 1:36 PM [phone]

## 2013-09-10 ENCOUNTER — Encounter (HOSPITAL_COMMUNITY): Payer: Self-pay | Admitting: Orthopedic Surgery

## 2015-11-02 IMAGING — CR DG FOREARM 2V*R*
2 series · 2 of 2 positions shown · non-contrast
Comparison: none

[x forearm ap right]
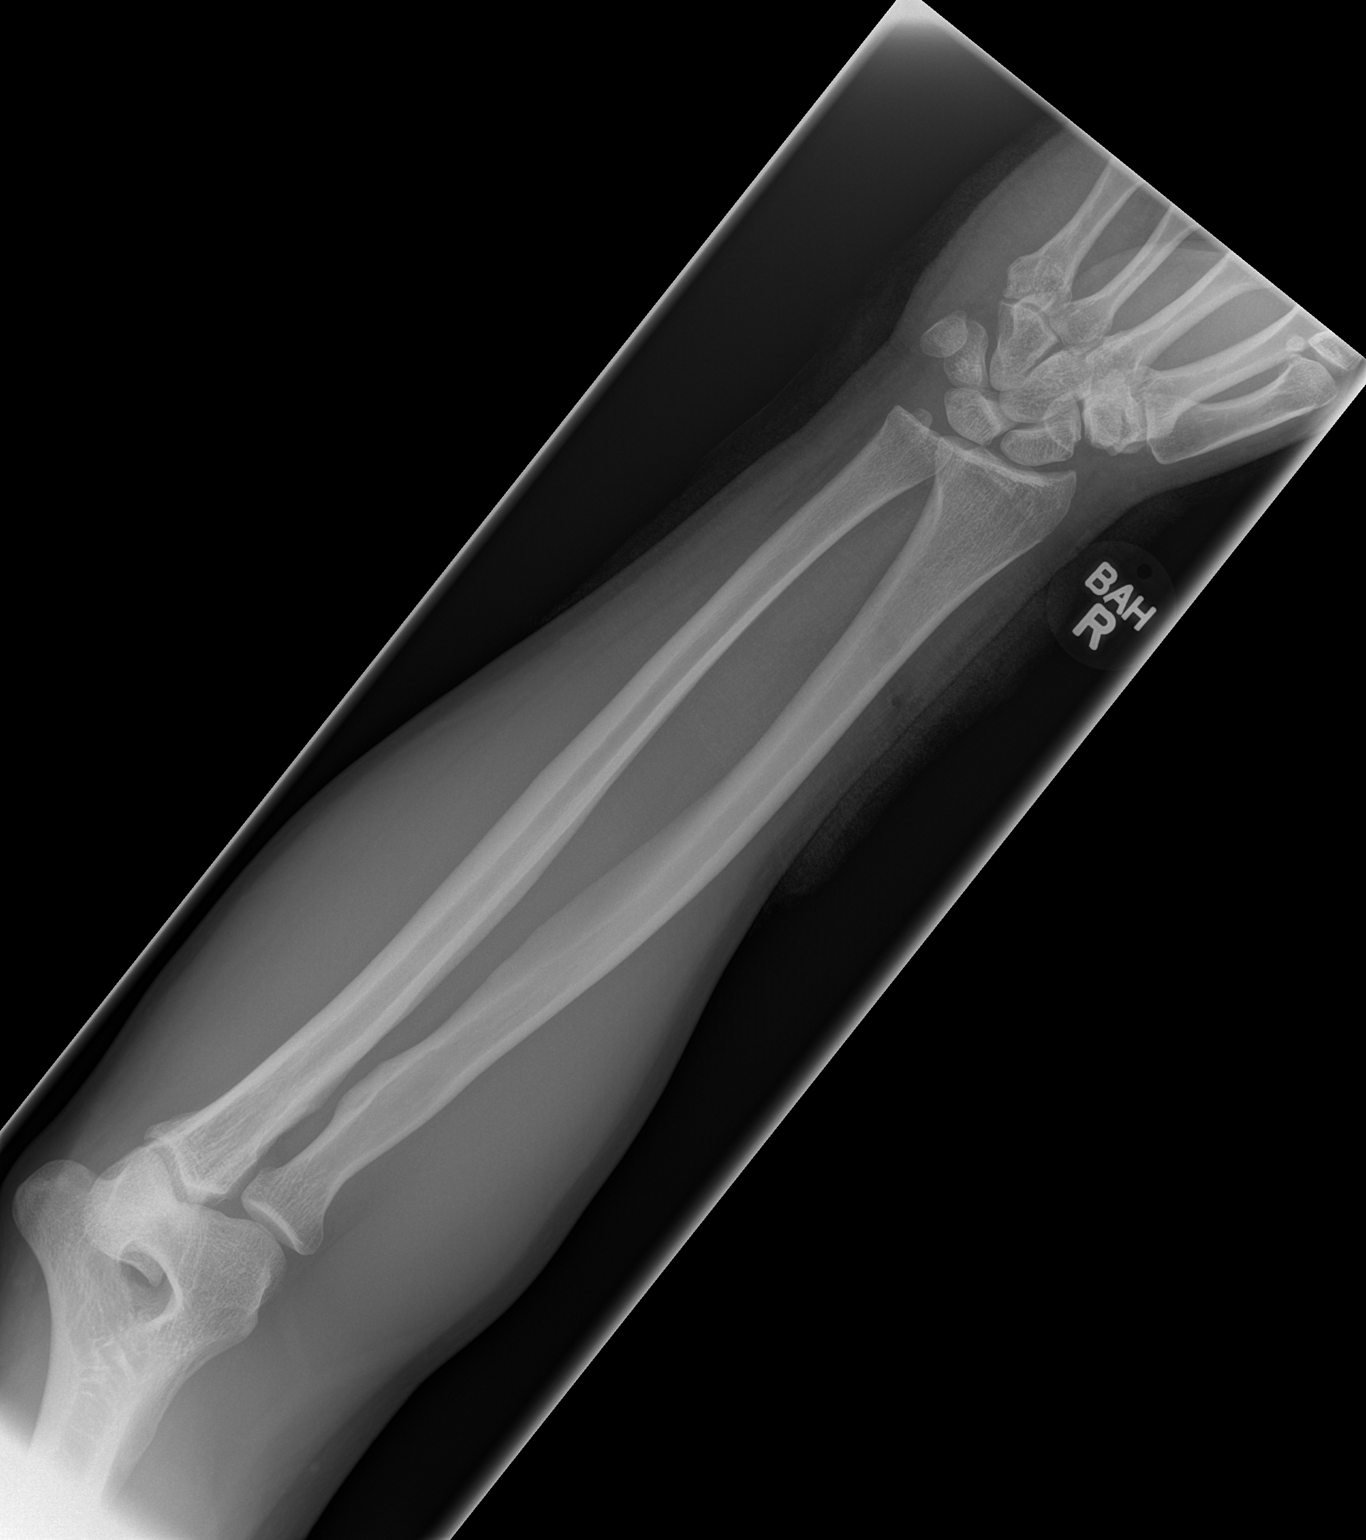

[x forearm lat right]
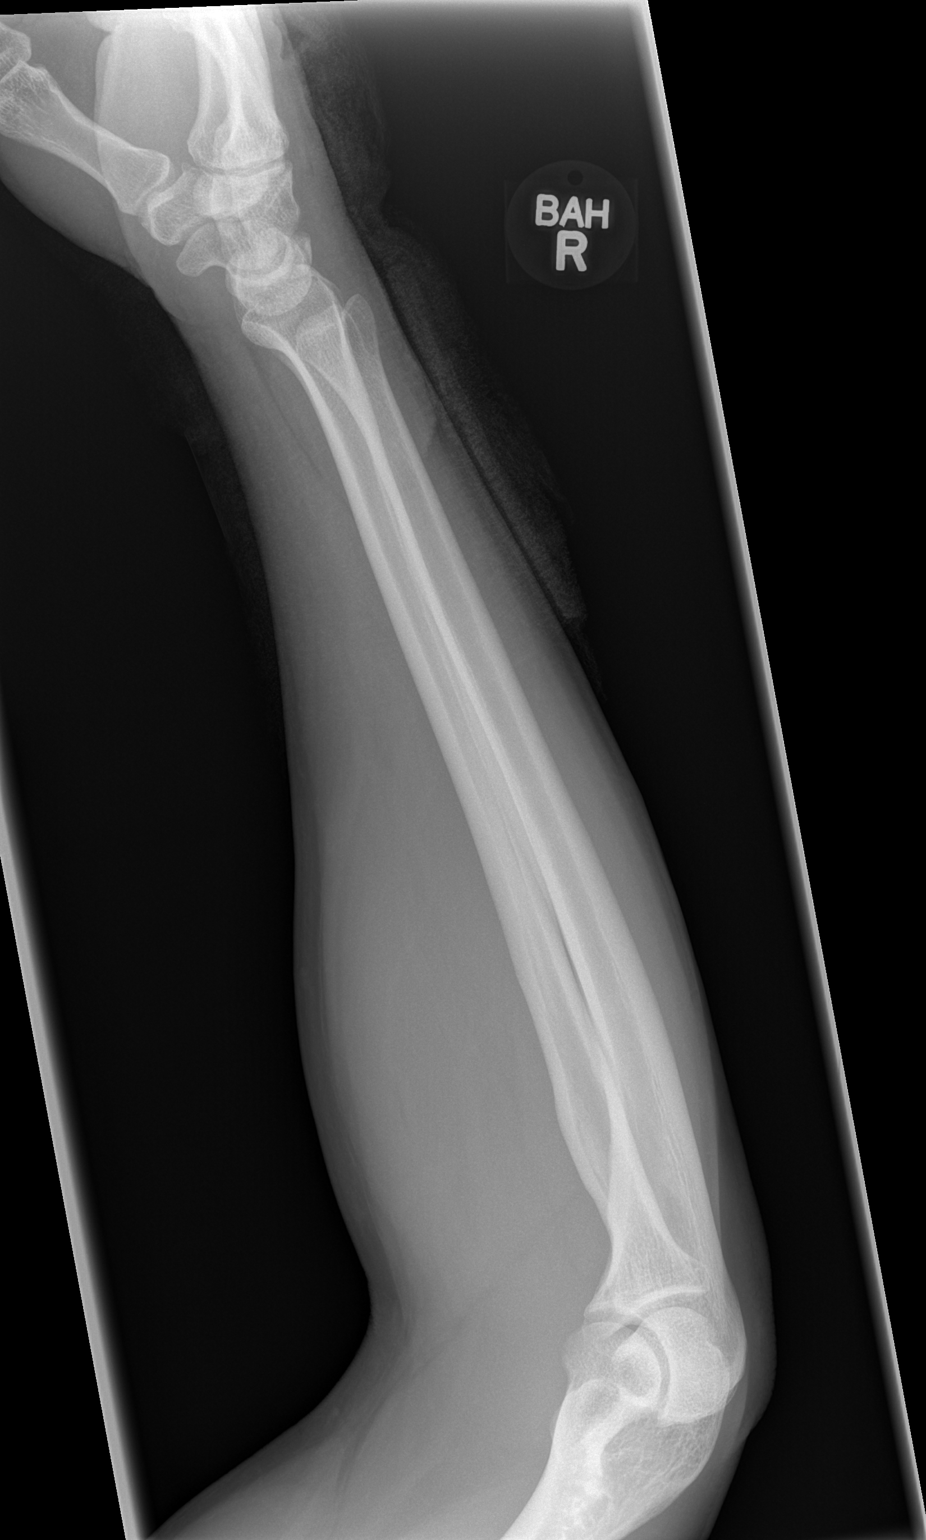

[2 of 2 positions shown; findings below may reference images not displayed]

CLINICAL DATA
Posterior laceration over the right hand and distal forearm

EXAM
RIGHT FOREARM - 2 VIEW

COMPARISON
None.

FINDINGS
AP and lateral views of the 4 head forearm reveal the radius and
ulna to be intact. There are no soft tissue foreign bodies. There is
disruption of the soft tissues over the distal third of the forearm
dorsolaterally.

IMPRESSION
There is no acute bony abnormality of the right radius or ulna.

SIGNATURE
